# Patient Record
Sex: Female | Born: 1995 | Race: White | Hispanic: No | Marital: Single | Smoking: Never smoker
Health system: Southern US, Community
[De-identification: ages and names within clinical notes are randomized; demographics above are authoritative.]

## PROBLEM LIST (undated history)

## (undated) DIAGNOSIS — K589 Irritable bowel syndrome without diarrhea: Secondary | ICD-10-CM

## (undated) DIAGNOSIS — K219 Gastro-esophageal reflux disease without esophagitis: Secondary | ICD-10-CM

## (undated) HISTORY — PX: WRIST SURGERY: SHX841

## (undated) HISTORY — PX: WISDOM TOOTH EXTRACTION: SHX21

## (undated) HISTORY — PX: COLONOSCOPY: SHX174

---

## 2016-08-10 ENCOUNTER — Encounter (HOSPITAL_COMMUNITY): Payer: Self-pay | Admitting: Emergency Medicine

## 2016-08-10 ENCOUNTER — Emergency Department (HOSPITAL_COMMUNITY)
Admission: EM | Admit: 2016-08-10 | Discharge: 2016-08-10 | Disposition: A | Discharge status: Home / Self Care | Payer: Managed Care, Other (non HMO) | Attending: Emergency Medicine | Admitting: Emergency Medicine

## 2016-08-10 DIAGNOSIS — R1084 Generalized abdominal pain: Secondary | ICD-10-CM

## 2016-08-10 DIAGNOSIS — Z79899 Other long term (current) drug therapy: Secondary | ICD-10-CM | POA: Insufficient documentation

## 2016-08-10 DIAGNOSIS — R197 Diarrhea, unspecified: Secondary | ICD-10-CM

## 2016-08-10 DIAGNOSIS — K529 Noninfective gastroenteritis and colitis, unspecified: Secondary | ICD-10-CM

## 2016-08-10 DIAGNOSIS — R1030 Lower abdominal pain, unspecified: Secondary | ICD-10-CM | POA: Diagnosis present

## 2016-08-10 DIAGNOSIS — R112 Nausea with vomiting, unspecified: Secondary | ICD-10-CM

## 2016-08-10 HISTORY — DX: Irritable bowel syndrome, unspecified: K58.9

## 2016-08-10 MED ORDER — LOPERAMIDE HCL 2 MG PO CAPS: 4.0000 mg | ORAL_CAPSULE | Freq: Every evening | ORAL | 0 refills | Status: AC | PRN

## 2016-08-10 MED ORDER — PROMETHAZINE HCL 25 MG RE SUPP: 25.0000 mg | Freq: Four times a day (QID) | RECTAL | 0 refills | Status: AC | PRN

## 2016-08-10 MED ORDER — ONDANSETRON HCL 4 MG/2ML IJ SOLN: 4.0000 mg | Freq: Once | INTRAMUSCULAR | Status: DC | PRN

## 2016-08-10 MED ORDER — ONDANSETRON 4 MG PO TBDP
4.0000 mg | ORAL_TABLET | Freq: Once | ORAL | Status: AC
  Administered 2016-08-10: 4 mg via ORAL
  Filled 2016-08-10: qty 1

## 2016-08-10 MED ORDER — ACETAMINOPHEN 325 MG PO TABS: 650.0000 mg | ORAL_TABLET | Freq: Once | ORAL | Status: DC

## 2016-08-10 MED ORDER — ONDANSETRON 8 MG PO TBDP: 8.0000 mg | ORAL_TABLET | Freq: Three times a day (TID) | ORAL | 0 refills | Status: AC | PRN

## 2016-08-10 NOTE — ED Triage Notes (Signed)
Patient c/o generalized abd pain with v/d since last night. Patient also started her period but abd pain more all over than her normal.  Patient states that she feels she is dehydrated.

## 2016-08-10 NOTE — ED Provider Notes (Signed)
[facility]-EMERGENCY DEPT Provider Note   CSN: [id number] Arrival date & time: 08/10/16  [id number]     History   Chief Complaint Chief Complaint  Patient presents with  . Abdominal Pain  . Emesis  . Diarrhea    HPI Julie BayleySarah Mattila is a 21 y.o. female.  HPI  (S) Julie [name] is a 21 y.o. female with complaint of gastrointestinal symptoms of watery diarrhea, lower abdominal cramps, nausea, vomiting, dehydration for 1 days. No blood in stool. Pt did eat sushi yday, but it seemed normal. No sick contacts, no travel hx. No fevers. Pt has IBS, but the current symptoms are not related to her ibs.  Pt has had 10 episodes of each emesis and diarrhea. LMP was 1 week ago.   Past Medical History:  Diagnosis Date  . IBS (irritable bowel syndrome)     There are no active problems to display for this patient.   Past Surgical History:  Procedure Laterality Date  . WRIST SURGERY      OB History    No data available       Home Medications    Prior to Admission medications   Medication Sig Start Date End Date Taking? Authorizing Provider  acetaminophen (TYLENOL) 500 MG tablet Take 500 mg by mouth every 6 (six) hours as needed for mild pain or headache.   Yes Historical Provider, MD  alosetron (LOTRONEX) 0.5 MG tablet Take 0.5 mg by mouth 2 (two) times daily.   Yes Historical Provider, MD  diphenoxylate-atropine (LOMOTIL) 2.5-0.025 MG tablet Take 1-2 tablets by mouth 4 (four) times daily as needed for diarrhea or loose stools.   Yes Historical Provider, MD  norelgestromin-ethinyl estradiol (ORTHO EVRA) 150-35 MCG/24HR transdermal patch Place 1 patch onto the skin once a week. Apply one patch weekly on Wednesday for 3 weeks  and leave off for 1 week   Yes Historical Provider, MD  loperamide (IMODIUM) 2 MG capsule Take 2 capsules (4 mg total) by mouth at bedtime as needed for diarrhea or loose stools. 08/10/16 08/16/16  Derwood KaplanAnkit Elyan Vanwieren, MD  ondansetron (ZOFRAN ODT) 8 MG disintegrating tablet Take 1  tablet (8 mg total) by mouth every 8 (eight) hours as needed for nausea. 08/10/16   Derwood KaplanAnkit Colleena Kurtenbach, MD  promethazine (PHENERGAN) 25 MG suppository Place 1 suppository (25 mg total) rectally every 6 (six) hours as needed for nausea. 08/10/16   Derwood KaplanAnkit Elvira Langston, MD    Family History No family history on file.  Social History Social History  Substance Use Topics  . Smoking status: Never Smoker  . Smokeless tobacco: Never Used  . Alcohol use Yes     Comment: occasional      Allergies   Cefzil [cefprozil] and Zithromax [azithromycin]   Review of Systems Review of Systems  Constitutional: Positive for activity change.  Gastrointestinal: Positive for abdominal pain, diarrhea, nausea and vomiting.  Genitourinary: Negative for dysuria.  Allergic/Immunologic: Negative for immunocompromised state.  Neurological: Negative for light-headedness.     Physical Exam Updated Vital Signs BP 124/91 (BP Location: Right Arm)   Pulse 106   Temp 98.7 F (37.1 C) (Oral)   Resp 18   Ht 5\' 6"  (1.676 m)   LMP 08/09/2016   SpO2 100%   Physical Exam  Constitutional: She is oriented to person, place, and time. She appears well-developed and well-nourished.  HENT:  Head: Normocephalic and atraumatic.  Mucus membranes are dry  Eyes: EOM are normal. Pupils are equal, round, and reactive to light.  Neck:  Neck supple.  Cardiovascular: Normal rate, regular rhythm and normal heart sounds.   No murmur heard. Pulmonary/Chest: Effort normal. No respiratory distress.  Abdominal: Soft. She exhibits no distension. There is no tenderness. There is no rebound and no guarding.  Neurological: She is alert and oriented to person, place, and time.  Skin: Skin is warm and dry.  Nursing note and vitals reviewed.    ED Treatments / Results  Labs (all labs ordered are listed, but only abnormal results are displayed) Labs Reviewed  URINALYSIS, ROUTINE W REFLEX MICROSCOPIC    EKG  EKG Interpretation None         Radiology No results found.  Procedures Procedures (including critical care time)  Medications Ordered in ED Medications  acetaminophen (TYLENOL) tablet 650 mg (not administered)  ondansetron (ZOFRAN-ODT) disintegrating tablet 4 mg (4 mg Oral Given 08/10/16 0901)     Initial Impression / Assessment and Plan / ED Course  I have reviewed the triage vital signs and the nursing notes.  Pertinent labs & imaging results that were available during my care of the patient were reviewed by me and considered in my medical decision making (see chart for details).     Pt with n/v/diarrhea/abd pain. Physical exam reveals the patient appears well. Hydration status: well hydrated. Abdomen: abdomen is soft without significant tenderness, masses, organomegaly or guarding..  (A) Viral Gastroenteritis  (P) I have recommended small amounts clear fluids frequently, Pedialyte, soups, juices, water, advance diet as tolerated and Imodium OTC prn diarrhea. Return office visit if symptoms persist or worsen; I have alerted the patient to call if high fever, dehydration, marked weakness, fainting, increased abdominal pain, blood in stool or vomit.  Final Clinical Impressions(s) / ED Diagnoses   Final diagnoses:  Nausea vomiting and diarrhea  Gastroenteritis  Generalized abdominal pain    New Prescriptions New Prescriptions   LOPERAMIDE (IMODIUM) 2 MG CAPSULE    Take 2 capsules (4 mg total) by mouth at bedtime as needed for diarrhea or loose stools.   ONDANSETRON (ZOFRAN ODT) 8 MG DISINTEGRATING TABLET    Take 1 tablet (8 mg total) by mouth every 8 (eight) hours as needed for nausea.   PROMETHAZINE (PHENERGAN) 25 MG SUPPOSITORY    Place 1 suppository (25 mg total) rectally every 6 (six) hours as needed for nausea.     Derwood Kaplan, MD 08/10/16 1120

## 2016-08-10 NOTE — Discharge Instructions (Signed)
Please return to the ER if your symptoms worsen; you have increased pain, fevers, chills, inability to keep any medications down, severe dehydration, bloody stools. Hydrate well. Clear liquid diet for the next 2-3 days and then slowly advance to normal foods.

## 2017-08-06 DIAGNOSIS — Z13220 Encounter for screening for lipoid disorders: Secondary | ICD-10-CM | POA: Diagnosis not present

## 2017-08-06 DIAGNOSIS — Z0000 Encounter for general adult medical examination without abnormal findings: Secondary | ICD-10-CM | POA: Diagnosis not present

## 2017-08-10 DIAGNOSIS — R1084 Generalized abdominal pain: Secondary | ICD-10-CM | POA: Diagnosis not present

## 2017-08-10 DIAGNOSIS — K580 Irritable bowel syndrome with diarrhea: Secondary | ICD-10-CM | POA: Diagnosis not present

## 2017-10-14 DIAGNOSIS — Z6828 Body mass index (BMI) 28.0-28.9, adult: Secondary | ICD-10-CM | POA: Diagnosis not present

## 2017-10-14 DIAGNOSIS — K580 Irritable bowel syndrome with diarrhea: Secondary | ICD-10-CM | POA: Diagnosis not present

## 2017-11-26 DIAGNOSIS — Z113 Encounter for screening for infections with a predominantly sexual mode of transmission: Secondary | ICD-10-CM | POA: Diagnosis not present

## 2017-11-26 DIAGNOSIS — Z01419 Encounter for gynecological examination (general) (routine) without abnormal findings: Secondary | ICD-10-CM | POA: Diagnosis not present

## 2018-04-18 DIAGNOSIS — H00014 Hordeolum externum left upper eyelid: Secondary | ICD-10-CM | POA: Diagnosis not present

## 2018-07-06 DIAGNOSIS — K580 Irritable bowel syndrome with diarrhea: Secondary | ICD-10-CM | POA: Diagnosis not present

## 2018-07-06 DIAGNOSIS — R1084 Generalized abdominal pain: Secondary | ICD-10-CM | POA: Diagnosis not present

## 2018-07-15 DIAGNOSIS — J029 Acute pharyngitis, unspecified: Secondary | ICD-10-CM | POA: Diagnosis not present

## 2018-12-01 DIAGNOSIS — Z01411 Encounter for gynecological examination (general) (routine) with abnormal findings: Secondary | ICD-10-CM | POA: Diagnosis not present

## 2018-12-01 DIAGNOSIS — Z113 Encounter for screening for infections with a predominantly sexual mode of transmission: Secondary | ICD-10-CM | POA: Diagnosis not present

## 2018-12-12 ENCOUNTER — Other Ambulatory Visit: Payer: Self-pay | Admitting: Obstetrics and Gynecology

## 2018-12-12 DIAGNOSIS — N644 Mastodynia: Secondary | ICD-10-CM

## 2018-12-12 DIAGNOSIS — N6310 Unspecified lump in the right breast, unspecified quadrant: Secondary | ICD-10-CM | POA: Diagnosis not present

## 2018-12-22 ENCOUNTER — Other Ambulatory Visit: Payer: Self-pay

## 2018-12-22 ENCOUNTER — Ambulatory Visit
Admission: RE | Admit: 2018-12-22 | Discharge: 2018-12-22 | Disposition: A | Discharge status: Home / Self Care | Payer: BC Managed Care – PPO | Source: Ambulatory Visit | Attending: Obstetrics and Gynecology | Admitting: Obstetrics and Gynecology

## 2018-12-22 DIAGNOSIS — N644 Mastodynia: Secondary | ICD-10-CM

## 2019-01-04 DIAGNOSIS — Z13220 Encounter for screening for lipoid disorders: Secondary | ICD-10-CM | POA: Diagnosis not present

## 2019-01-04 DIAGNOSIS — K580 Irritable bowel syndrome with diarrhea: Secondary | ICD-10-CM | POA: Diagnosis not present

## 2019-01-04 DIAGNOSIS — Z0000 Encounter for general adult medical examination without abnormal findings: Secondary | ICD-10-CM | POA: Diagnosis not present

## 2019-01-04 DIAGNOSIS — Z23 Encounter for immunization: Secondary | ICD-10-CM | POA: Diagnosis not present

## 2019-01-12 DIAGNOSIS — B360 Pityriasis versicolor: Secondary | ICD-10-CM | POA: Diagnosis not present

## 2019-02-16 DIAGNOSIS — Z20828 Contact with and (suspected) exposure to other viral communicable diseases: Secondary | ICD-10-CM | POA: Diagnosis not present

## 2019-04-11 DIAGNOSIS — Z20828 Contact with and (suspected) exposure to other viral communicable diseases: Secondary | ICD-10-CM | POA: Diagnosis not present

## 2019-04-30 DIAGNOSIS — N390 Urinary tract infection, site not specified: Secondary | ICD-10-CM | POA: Diagnosis not present

## 2019-05-09 DIAGNOSIS — Z20828 Contact with and (suspected) exposure to other viral communicable diseases: Secondary | ICD-10-CM | POA: Diagnosis not present

## 2019-05-09 DIAGNOSIS — J029 Acute pharyngitis, unspecified: Secondary | ICD-10-CM | POA: Diagnosis not present

## 2019-05-09 DIAGNOSIS — Z7189 Other specified counseling: Secondary | ICD-10-CM | POA: Diagnosis not present

## 2019-05-10 DIAGNOSIS — Z03818 Encounter for observation for suspected exposure to other biological agents ruled out: Secondary | ICD-10-CM | POA: Diagnosis not present

## 2019-05-11 DIAGNOSIS — Z20828 Contact with and (suspected) exposure to other viral communicable diseases: Secondary | ICD-10-CM | POA: Diagnosis not present

## 2019-06-19 DIAGNOSIS — Z20828 Contact with and (suspected) exposure to other viral communicable diseases: Secondary | ICD-10-CM | POA: Diagnosis not present

## 2019-06-21 DIAGNOSIS — J358 Other chronic diseases of tonsils and adenoids: Secondary | ICD-10-CM | POA: Diagnosis not present

## 2019-07-05 ENCOUNTER — Other Ambulatory Visit: Payer: Self-pay

## 2019-07-05 DIAGNOSIS — Z20822 Contact with and (suspected) exposure to covid-19: Secondary | ICD-10-CM

## 2019-07-06 LAB — NOVEL CORONAVIRUS, NAA: SARS-CoV-2, NAA: NOT DETECTED

## 2019-12-15 DIAGNOSIS — Z03818 Encounter for observation for suspected exposure to other biological agents ruled out: Secondary | ICD-10-CM | POA: Diagnosis not present

## 2019-12-15 DIAGNOSIS — Z20822 Contact with and (suspected) exposure to covid-19: Secondary | ICD-10-CM | POA: Diagnosis not present

## 2019-12-18 DIAGNOSIS — Z01419 Encounter for gynecological examination (general) (routine) without abnormal findings: Secondary | ICD-10-CM | POA: Diagnosis not present

## 2019-12-18 DIAGNOSIS — Z124 Encounter for screening for malignant neoplasm of cervix: Secondary | ICD-10-CM | POA: Diagnosis not present

## 2020-01-05 DIAGNOSIS — Z13220 Encounter for screening for lipoid disorders: Secondary | ICD-10-CM | POA: Diagnosis not present

## 2020-01-05 DIAGNOSIS — K580 Irritable bowel syndrome with diarrhea: Secondary | ICD-10-CM | POA: Diagnosis not present

## 2020-01-05 DIAGNOSIS — Z0000 Encounter for general adult medical examination without abnormal findings: Secondary | ICD-10-CM | POA: Diagnosis not present

## 2020-01-30 DIAGNOSIS — Z20822 Contact with and (suspected) exposure to covid-19: Secondary | ICD-10-CM | POA: Diagnosis not present

## 2020-03-05 DIAGNOSIS — L309 Dermatitis, unspecified: Secondary | ICD-10-CM | POA: Diagnosis not present

## 2020-05-13 DIAGNOSIS — Z20822 Contact with and (suspected) exposure to covid-19: Secondary | ICD-10-CM | POA: Diagnosis not present

## 2020-06-06 DIAGNOSIS — Z20822 Contact with and (suspected) exposure to covid-19: Secondary | ICD-10-CM | POA: Diagnosis not present

## 2020-06-06 DIAGNOSIS — Z03818 Encounter for observation for suspected exposure to other biological agents ruled out: Secondary | ICD-10-CM | POA: Diagnosis not present

## 2020-06-26 DIAGNOSIS — Z20822 Contact with and (suspected) exposure to covid-19: Secondary | ICD-10-CM | POA: Diagnosis not present

## 2020-08-03 DIAGNOSIS — Z03818 Encounter for observation for suspected exposure to other biological agents ruled out: Secondary | ICD-10-CM | POA: Diagnosis not present

## 2020-08-03 DIAGNOSIS — Z20822 Contact with and (suspected) exposure to covid-19: Secondary | ICD-10-CM | POA: Diagnosis not present

## 2020-08-14 DIAGNOSIS — Z20822 Contact with and (suspected) exposure to covid-19: Secondary | ICD-10-CM | POA: Diagnosis not present

## 2020-09-21 IMAGING — US ULTRASOUND RIGHT BREAST LIMITED
1 series · 2 of 2 positions shown · non-contrast
Comparison: None.

CLINICAL DATA: Right breast tenderness and focal thickening seen
clinically.

EXAM:
ULTRASOUND OF THE RIGHT BREAST

[Series 1: ultrasound right breast limited · 0.07mm/px · 2 of 2 slices shown]
[im 1/2]
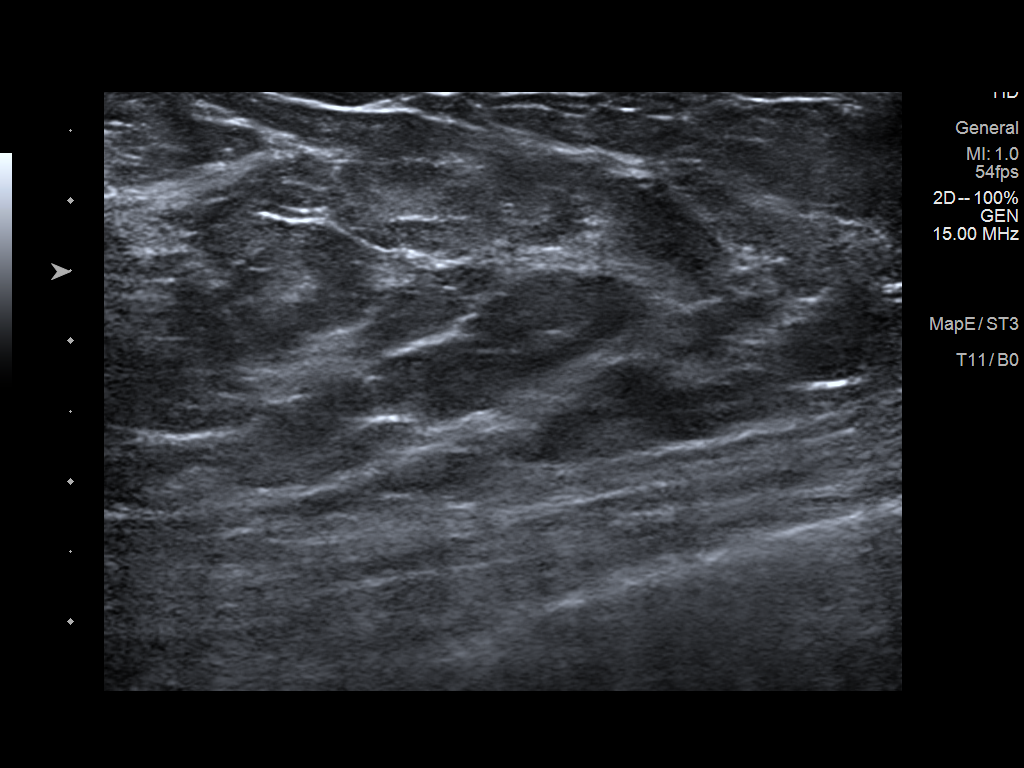
[im 2/2]
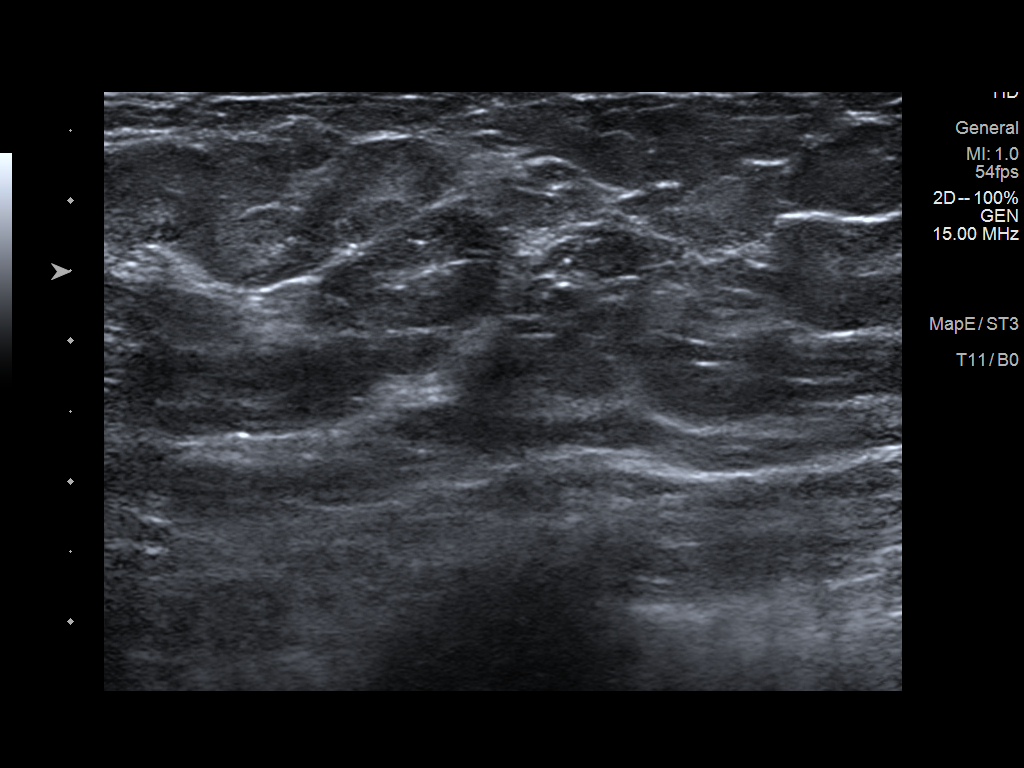

[2 of 2 positions shown; findings below may reference images not displayed]

FINDINGS: On physical exam,

Targeted ultrasound is performed, showing no suspicious masses or
shadowing lesions at the site thickening/tenderness.
IMPRESSION: No sonographic evidence of malignancy in the right breast.

RECOMMENDATION:
Further management of patient's right breast findings should be
based on clinical grounds.

Screening mammogram at age 39 unless there are persistent or
intervening clinical concerns. (Code:T6-O-W7X)

I have discussed the findings and recommendations with the patient.
Results were also provided in writing at the conclusion of the
visit. If applicable, a reminder letter will be sent to the patient
regarding the next appointment.

BI-RADS CATEGORY  1: Negative.

## 2020-10-21 DIAGNOSIS — Z20822 Contact with and (suspected) exposure to covid-19: Secondary | ICD-10-CM | POA: Diagnosis not present

## 2020-10-23 DIAGNOSIS — Z20822 Contact with and (suspected) exposure to covid-19: Secondary | ICD-10-CM | POA: Diagnosis not present

## 2020-11-14 DIAGNOSIS — S61211A Laceration without foreign body of left index finger without damage to nail, initial encounter: Secondary | ICD-10-CM | POA: Diagnosis not present

## 2020-11-22 DIAGNOSIS — R21 Rash and other nonspecific skin eruption: Secondary | ICD-10-CM | POA: Diagnosis not present

## 2020-11-22 DIAGNOSIS — S61311D Laceration without foreign body of left index finger with damage to nail, subsequent encounter: Secondary | ICD-10-CM | POA: Diagnosis not present

## 2020-11-26 DIAGNOSIS — Z20822 Contact with and (suspected) exposure to covid-19: Secondary | ICD-10-CM | POA: Diagnosis not present

## 2020-12-04 DIAGNOSIS — Z20822 Contact with and (suspected) exposure to covid-19: Secondary | ICD-10-CM | POA: Diagnosis not present

## 2020-12-23 DIAGNOSIS — Z01419 Encounter for gynecological examination (general) (routine) without abnormal findings: Secondary | ICD-10-CM | POA: Diagnosis not present

## 2020-12-31 DIAGNOSIS — L817 Pigmented purpuric dermatosis: Secondary | ICD-10-CM | POA: Diagnosis not present

## 2021-01-08 DIAGNOSIS — K580 Irritable bowel syndrome with diarrhea: Secondary | ICD-10-CM | POA: Diagnosis not present

## 2021-01-08 DIAGNOSIS — Z0000 Encounter for general adult medical examination without abnormal findings: Secondary | ICD-10-CM | POA: Diagnosis not present

## 2021-01-08 DIAGNOSIS — Z13220 Encounter for screening for lipoid disorders: Secondary | ICD-10-CM | POA: Diagnosis not present

## 2021-01-28 DIAGNOSIS — Z20822 Contact with and (suspected) exposure to covid-19: Secondary | ICD-10-CM | POA: Diagnosis not present

## 2021-02-04 DIAGNOSIS — B360 Pityriasis versicolor: Secondary | ICD-10-CM | POA: Diagnosis not present

## 2021-02-04 DIAGNOSIS — Z20822 Contact with and (suspected) exposure to covid-19: Secondary | ICD-10-CM | POA: Diagnosis not present

## 2021-02-04 DIAGNOSIS — L819 Disorder of pigmentation, unspecified: Secondary | ICD-10-CM | POA: Diagnosis not present

## 2021-03-05 DIAGNOSIS — Z20822 Contact with and (suspected) exposure to covid-19: Secondary | ICD-10-CM | POA: Diagnosis not present

## 2021-03-19 DIAGNOSIS — L700 Acne vulgaris: Secondary | ICD-10-CM | POA: Diagnosis not present

## 2021-07-17 DIAGNOSIS — Z20822 Contact with and (suspected) exposure to covid-19: Secondary | ICD-10-CM | POA: Diagnosis not present

## 2021-12-23 DIAGNOSIS — Z01419 Encounter for gynecological examination (general) (routine) without abnormal findings: Secondary | ICD-10-CM | POA: Diagnosis not present

## 2022-01-16 DIAGNOSIS — Z13220 Encounter for screening for lipoid disorders: Secondary | ICD-10-CM | POA: Diagnosis not present

## 2022-01-16 DIAGNOSIS — R21 Rash and other nonspecific skin eruption: Secondary | ICD-10-CM | POA: Diagnosis not present

## 2022-01-16 DIAGNOSIS — Z0000 Encounter for general adult medical examination without abnormal findings: Secondary | ICD-10-CM | POA: Diagnosis not present

## 2022-01-16 DIAGNOSIS — K580 Irritable bowel syndrome with diarrhea: Secondary | ICD-10-CM | POA: Diagnosis not present

## 2022-01-28 DIAGNOSIS — B360 Pityriasis versicolor: Secondary | ICD-10-CM | POA: Diagnosis not present

## 2022-02-11 DIAGNOSIS — J029 Acute pharyngitis, unspecified: Secondary | ICD-10-CM | POA: Diagnosis not present

## 2022-02-11 DIAGNOSIS — Z6831 Body mass index (BMI) 31.0-31.9, adult: Secondary | ICD-10-CM | POA: Diagnosis not present

## 2022-02-11 DIAGNOSIS — K589 Irritable bowel syndrome without diarrhea: Secondary | ICD-10-CM | POA: Insufficient documentation

## 2022-02-12 DIAGNOSIS — Z6831 Body mass index (BMI) 31.0-31.9, adult: Secondary | ICD-10-CM | POA: Diagnosis not present

## 2022-02-12 DIAGNOSIS — J029 Acute pharyngitis, unspecified: Secondary | ICD-10-CM | POA: Diagnosis not present

## 2022-06-27 ENCOUNTER — Ambulatory Visit: Admit: 2022-06-27 | Discharge status: Absent without leave | Payer: BC Managed Care – PPO

## 2022-06-28 DIAGNOSIS — U071 COVID-19: Secondary | ICD-10-CM | POA: Diagnosis not present

## 2022-06-29 DIAGNOSIS — U071 COVID-19: Secondary | ICD-10-CM | POA: Diagnosis not present

## 2022-06-29 DIAGNOSIS — H1033 Unspecified acute conjunctivitis, bilateral: Secondary | ICD-10-CM | POA: Diagnosis not present

## 2022-06-29 DIAGNOSIS — J019 Acute sinusitis, unspecified: Secondary | ICD-10-CM | POA: Diagnosis not present

## 2022-06-29 DIAGNOSIS — H66003 Acute suppurative otitis media without spontaneous rupture of ear drum, bilateral: Secondary | ICD-10-CM | POA: Diagnosis not present

## 2022-07-06 DIAGNOSIS — H6993 Unspecified Eustachian tube disorder, bilateral: Secondary | ICD-10-CM | POA: Diagnosis not present

## 2022-11-29 DIAGNOSIS — S70269A Insect bite (nonvenomous), unspecified hip, initial encounter: Secondary | ICD-10-CM | POA: Diagnosis not present

## 2022-11-29 DIAGNOSIS — S80869A Insect bite (nonvenomous), unspecified lower leg, initial encounter: Secondary | ICD-10-CM | POA: Diagnosis not present

## 2022-11-29 DIAGNOSIS — L089 Local infection of the skin and subcutaneous tissue, unspecified: Secondary | ICD-10-CM | POA: Diagnosis not present

## 2022-11-29 DIAGNOSIS — S70369A Insect bite (nonvenomous), unspecified thigh, initial encounter: Secondary | ICD-10-CM | POA: Diagnosis not present

## 2023-01-07 DIAGNOSIS — Z124 Encounter for screening for malignant neoplasm of cervix: Secondary | ICD-10-CM | POA: Diagnosis not present

## 2023-01-07 DIAGNOSIS — Z01419 Encounter for gynecological examination (general) (routine) without abnormal findings: Secondary | ICD-10-CM | POA: Diagnosis not present

## 2023-01-07 DIAGNOSIS — Z6833 Body mass index (BMI) 33.0-33.9, adult: Secondary | ICD-10-CM | POA: Diagnosis not present

## 2023-01-07 DIAGNOSIS — Z302 Encounter for sterilization: Secondary | ICD-10-CM | POA: Diagnosis not present

## 2023-02-04 DIAGNOSIS — Z13220 Encounter for screening for lipoid disorders: Secondary | ICD-10-CM | POA: Diagnosis not present

## 2023-02-04 DIAGNOSIS — K580 Irritable bowel syndrome with diarrhea: Secondary | ICD-10-CM | POA: Diagnosis not present

## 2023-02-04 DIAGNOSIS — J302 Other seasonal allergic rhinitis: Secondary | ICD-10-CM | POA: Diagnosis not present

## 2023-02-04 DIAGNOSIS — Z0000 Encounter for general adult medical examination without abnormal findings: Secondary | ICD-10-CM | POA: Diagnosis not present

## 2023-02-04 DIAGNOSIS — K219 Gastro-esophageal reflux disease without esophagitis: Secondary | ICD-10-CM | POA: Diagnosis not present

## 2023-05-25 ENCOUNTER — Encounter (HOSPITAL_BASED_OUTPATIENT_CLINIC_OR_DEPARTMENT_OTHER): Payer: Self-pay | Admitting: Obstetrics

## 2023-05-25 NOTE — Progress Notes (Signed)
Spoke w/ via phone for pre-op interview--- Julie [name] needs dos----  CBC, UPT and T&S per surgeon.       [name] results------ COVID test -----patient states asymptomatic no test needed Arrive at -------0730 NPO after MN NO Solid Food.   Med rec completed Medications to take morning of surgery -----Pepcid and Viberzi Diabetic medication ----- Patient instructed no nail polish to be worn day of surgery Patient instructed to bring photo id and insurance card day of surgery Patient aware to have Driver (ride ) / caregiver    for 24 hours after surgery - Mother Julie [name] Patient Special Instructions ----- Pre-Op special Instructions ----- Patient verbalized understanding of instructions that were given at this phone interview. Patient denies chest pain, sob, fever, cough at the interview.

## 2023-06-04 ENCOUNTER — Other Ambulatory Visit: Payer: Self-pay

## 2023-06-04 ENCOUNTER — Encounter (HOSPITAL_BASED_OUTPATIENT_CLINIC_OR_DEPARTMENT_OTHER)
Admission: RE | Disposition: A | Discharge status: Home / Self Care | Payer: Self-pay | Source: Home / Self Care | Attending: Obstetrics

## 2023-06-04 ENCOUNTER — Ambulatory Visit (HOSPITAL_BASED_OUTPATIENT_CLINIC_OR_DEPARTMENT_OTHER): Payer: BC Managed Care – PPO | Admitting: Anesthesiology

## 2023-06-04 ENCOUNTER — Encounter (HOSPITAL_BASED_OUTPATIENT_CLINIC_OR_DEPARTMENT_OTHER): Payer: Self-pay | Admitting: Obstetrics

## 2023-06-04 ENCOUNTER — Ambulatory Visit (HOSPITAL_BASED_OUTPATIENT_CLINIC_OR_DEPARTMENT_OTHER)
Admission: RE | Admit: 2023-06-04 | Discharge: 2023-06-04 | Disposition: A | Discharge status: Home / Self Care | Payer: BC Managed Care – PPO | Attending: Obstetrics | Admitting: Obstetrics

## 2023-06-04 DIAGNOSIS — D282 Benign neoplasm of uterine tubes and ligaments: Secondary | ICD-10-CM | POA: Diagnosis not present

## 2023-06-04 DIAGNOSIS — Z302 Encounter for sterilization: Secondary | ICD-10-CM | POA: Insufficient documentation

## 2023-06-04 DIAGNOSIS — Z3009 Encounter for other general counseling and advice on contraception: Secondary | ICD-10-CM

## 2023-06-04 HISTORY — PX: LAPAROSCOPIC BILATERAL SALPINGECTOMY: SHX5889

## 2023-06-04 HISTORY — DX: Gastro-esophageal reflux disease without esophagitis: K21.9

## 2023-06-04 LAB — ABO/RH: ABO/RH(D): O POS

## 2023-06-04 LAB — TYPE AND SCREEN
ABO/RH(D): O POS
Antibody Screen: NEGATIVE

## 2023-06-04 LAB — CBC
HCT: 40.5 % (ref 36.0–46.0)
Hemoglobin: 13.5 g/dL (ref 12.0–15.0)
MCH: 30.1 pg (ref 26.0–34.0)
MCHC: 33.3 g/dL (ref 30.0–36.0)
MCV: 90.2 fL (ref 80.0–100.0)
Platelets: 309 10*3/uL (ref 150–400)
RBC: 4.49 MIL/uL (ref 3.87–5.11)
RDW: 13.4 % (ref 11.5–15.5)
WBC: 7.8 10*3/uL (ref 4.0–10.5)
nRBC: 0 % (ref 0.0–0.2)

## 2023-06-04 LAB — POCT PREGNANCY, URINE: Preg Test, Ur: NEGATIVE

## 2023-06-04 SURGERY — SALPINGECTOMY, BILATERAL, LAPAROSCOPIC
Anesthesia: General | Site: Abdomen | Laterality: Bilateral

## 2023-06-04 MED ORDER — SUGAMMADEX SODIUM 200 MG/2ML IV SOLN
INTRAVENOUS | Status: DC | PRN
  Administered 2023-06-04: 200 mg via INTRAVENOUS

## 2023-06-04 MED ORDER — SODIUM CHLORIDE 0.9 % IV SOLN: INTRAVENOUS | Status: DC

## 2023-06-04 MED ORDER — DEXAMETHASONE SODIUM PHOSPHATE 10 MG/ML IJ SOLN
INTRAMUSCULAR | Status: AC
  Filled 2023-06-04: qty 1

## 2023-06-04 MED ORDER — BUPIVACAINE HCL (PF) 0.25 % IJ SOLN
INTRAMUSCULAR | Status: DC | PRN
  Administered 2023-06-04: 13 mL

## 2023-06-04 MED ORDER — ONDANSETRON HCL 4 MG/2ML IJ SOLN
INTRAMUSCULAR | Status: DC | PRN
  Administered 2023-06-04: 4 mg via INTRAVENOUS

## 2023-06-04 MED ORDER — PROPOFOL 10 MG/ML IV BOLUS
INTRAVENOUS | Status: AC
  Filled 2023-06-04: qty 20

## 2023-06-04 MED ORDER — LIDOCAINE HCL (CARDIAC) PF 100 MG/5ML IV SOSY
PREFILLED_SYRINGE | INTRAVENOUS | Status: DC | PRN
  Administered 2023-06-04: 60 mg via INTRAVENOUS

## 2023-06-04 MED ORDER — KETOROLAC TROMETHAMINE 30 MG/ML IJ SOLN
INTRAMUSCULAR | Status: AC
  Filled 2023-06-04: qty 1

## 2023-06-04 MED ORDER — ROCURONIUM BROMIDE 10 MG/ML (PF) SYRINGE
PREFILLED_SYRINGE | INTRAVENOUS | Status: AC
  Filled 2023-06-04: qty 10

## 2023-06-04 MED ORDER — ACETAMINOPHEN 500 MG PO TABS
ORAL_TABLET | ORAL | Status: AC
  Filled 2023-06-04: qty 2

## 2023-06-04 MED ORDER — FENTANYL CITRATE (PF) 100 MCG/2ML IJ SOLN
INTRAMUSCULAR | Status: DC | PRN
  Administered 2023-06-04 (×4): 50 ug via INTRAVENOUS

## 2023-06-04 MED ORDER — PROPOFOL 10 MG/ML IV BOLUS
INTRAVENOUS | Status: DC | PRN
  Administered 2023-06-04: 200 mg via INTRAVENOUS

## 2023-06-04 MED ORDER — FENTANYL CITRATE (PF) 100 MCG/2ML IJ SOLN
INTRAMUSCULAR | Status: AC
  Filled 2023-06-04: qty 2

## 2023-06-04 MED ORDER — ONDANSETRON HCL 4 MG/2ML IJ SOLN
INTRAMUSCULAR | Status: AC
  Filled 2023-06-04: qty 2

## 2023-06-04 MED ORDER — ROCURONIUM BROMIDE 100 MG/10ML IV SOLN
INTRAVENOUS | Status: DC | PRN
  Administered 2023-06-04: 50 mg via INTRAVENOUS

## 2023-06-04 MED ORDER — DEXMEDETOMIDINE HCL IN NACL 80 MCG/20ML IV SOLN
INTRAVENOUS | Status: DC | PRN
  Administered 2023-06-04: 8 ug via INTRAVENOUS

## 2023-06-04 MED ORDER — MIDAZOLAM HCL 2 MG/2ML IJ SOLN
INTRAMUSCULAR | Status: AC
Start: 2023-06-04 — End: ?
  Filled 2023-06-04: qty 2

## 2023-06-04 MED ORDER — FENTANYL CITRATE (PF) 100 MCG/2ML IJ SOLN: 25.0000 ug | INTRAMUSCULAR | Status: DC | PRN

## 2023-06-04 MED ORDER — SCOPOLAMINE 1 MG/3DAYS TD PT72
1.0000 | MEDICATED_PATCH | TRANSDERMAL | Status: DC
  Administered 2023-06-04: 1.5 mg via TRANSDERMAL

## 2023-06-04 MED ORDER — HYDROMORPHONE HCL 2 MG PO TABS: 2.0000 mg | ORAL_TABLET | Freq: Four times a day (QID) | ORAL | 0 refills | Status: AC | PRN

## 2023-06-04 MED ORDER — OXYCODONE HCL 5 MG PO TABS: 5.0000 mg | ORAL_TABLET | Freq: Once | ORAL | Status: DC | PRN

## 2023-06-04 MED ORDER — KETOROLAC TROMETHAMINE 30 MG/ML IJ SOLN
INTRAMUSCULAR | Status: DC | PRN
  Administered 2023-06-04: 30 mg via INTRAVENOUS

## 2023-06-04 MED ORDER — LIDOCAINE HCL (PF) 2 % IJ SOLN
INTRAMUSCULAR | Status: AC
  Filled 2023-06-04: qty 5

## 2023-06-04 MED ORDER — ACETAMINOPHEN 500 MG PO TABS
1000.0000 mg | ORAL_TABLET | Freq: Once | ORAL | Status: AC
Start: 2023-06-04 — End: 2023-06-04
  Administered 2023-06-04: 1000 mg via ORAL

## 2023-06-04 MED ORDER — SCOPOLAMINE 1 MG/3DAYS TD PT72
MEDICATED_PATCH | TRANSDERMAL | Status: AC
  Filled 2023-06-04: qty 1

## 2023-06-04 MED ORDER — OXYCODONE HCL 5 MG/5ML PO SOLN: 5.0000 mg | Freq: Once | ORAL | Status: DC | PRN

## 2023-06-04 MED ORDER — LACTATED RINGERS IV SOLN: INTRAVENOUS | Status: DC

## 2023-06-04 MED ORDER — PHENYLEPHRINE 80 MCG/ML (10ML) SYRINGE FOR IV PUSH (FOR BLOOD PRESSURE SUPPORT)
PREFILLED_SYRINGE | INTRAVENOUS | Status: AC
  Filled 2023-06-04: qty 10

## 2023-06-04 MED ORDER — AMISULPRIDE (ANTIEMETIC) 5 MG/2ML IV SOLN: 10.0000 mg | Freq: Once | INTRAVENOUS | Status: DC | PRN

## 2023-06-04 MED ORDER — MIDAZOLAM HCL 2 MG/2ML IJ SOLN
INTRAMUSCULAR | Status: DC | PRN
  Administered 2023-06-04: 2 mg via INTRAVENOUS

## 2023-06-04 MED ORDER — DEXAMETHASONE SODIUM PHOSPHATE 4 MG/ML IJ SOLN
INTRAMUSCULAR | Status: DC | PRN
  Administered 2023-06-04: 8 mg via INTRAVENOUS

## 2023-06-04 SURGICAL SUPPLY — 29 items
COVER MAYO STAND STRL (DRAPES) ×1 IMPLANT
DERMABOND ADVANCED .7 DNX12 (GAUZE/BANDAGES/DRESSINGS) ×1 IMPLANT
DRAPE SURG IRRIG POUCH 19X23 (DRAPES) ×1 IMPLANT
DURAPREP 26ML APPLICATOR (WOUND CARE) ×1 IMPLANT
GAUZE 4X4 16PLY ~~LOC~~+RFID DBL (SPONGE) IMPLANT
GLOVE BIOGEL PI IND STRL 6.5 (GLOVE) ×2 IMPLANT
GLOVE ECLIPSE 6.0 STRL STRAW (GLOVE) ×1 IMPLANT
GOWN STRL REUS W/TWL LRG LVL3 (GOWN DISPOSABLE) ×1 IMPLANT
IRRIG SUCT STRYKERFLOW 2 WTIP (MISCELLANEOUS) IMPLANT
IRRIGATION SUCT STRKRFLW 2 WTP (MISCELLANEOUS) IMPLANT
KIT PINK PAD W/HEAD ARE REST (MISCELLANEOUS) ×1 IMPLANT
KIT PINK PAD W/HEAD ARM REST (MISCELLANEOUS) ×1 IMPLANT
KIT TURNOVER CYSTO (KITS) ×1 IMPLANT
LIGASURE VESSEL 5MM BLUNT TIP (ELECTROSURGICAL) IMPLANT
NS IRRIG 1000ML POUR BTL (IV SOLUTION) ×1 IMPLANT
PACK LAPAROSCOPY BASIN (CUSTOM PROCEDURE TRAY) ×1 IMPLANT
PROTECTOR NERVE ULNAR (MISCELLANEOUS) ×2 IMPLANT
SET TUBE SMOKE EVAC HIGH FLOW (TUBING) ×1 IMPLANT
SLEEVE SCD COMPRESS KNEE MED (STOCKING) ×1 IMPLANT
SLEEVE Z-THREAD 5X100MM (TROCAR) ×1 IMPLANT
SUT VICRYL RAPIDE 4/0 PS 2 (SUTURE) ×1 IMPLANT
SYS BAG RETRIEVAL 10MM (BASKET) ×1 IMPLANT
SYSTEM BAG RETRIEVAL 10MM (BASKET) IMPLANT
SYSTEM CARTER THOMASON II (TROCAR) IMPLANT
TOWEL OR 17X24 6PK STRL BLUE (TOWEL DISPOSABLE) ×1 IMPLANT
TRAY FOLEY W/BAG SLVR 14FR LF (SET/KITS/TRAYS/PACK) IMPLANT
TROCAR Z-THREAD FIOS 11X100 BL (TROCAR) IMPLANT
TROCAR Z-THREAD FIOS 5X100MM (TROCAR) ×1 IMPLANT
WARMER LAPAROSCOPE (MISCELLANEOUS) ×1 IMPLANT

## 2023-06-04 NOTE — Anesthesia Procedure Notes (Signed)
Procedure Name: Intubation Date/Time: 06/04/2023 9:21 AM  Performed by: Earmon Phoenix, CRNAPre-anesthesia Checklist: Patient identified, Emergency Drugs available, Suction available, Patient being monitored and Timeout performed Patient Re-evaluated:Patient Re-evaluated prior to induction Oxygen Delivery Method: Circle system utilized Preoxygenation: Pre-oxygenation with 100% oxygen Induction Type: IV induction Ventilation: Mask ventilation without difficulty Laryngoscope Size: Mac and 3 Grade View: Grade I Placement Confirmation: positive ETCO2, breath sounds checked- equal and bilateral and ETT inserted through vocal cords under direct vision Secured at: 21 cm Tube secured with: Tape Dental Injury: Teeth and Oropharynx as per pre-operative assessment

## 2023-06-04 NOTE — Transfer of Care (Signed)
Immediate Anesthesia Transfer of Care Note  Patient: Julie [name]  Procedure(s) Performed: LAPAROSCOPIC BILATERAL SALPINGECTOMY (Bilateral: Abdomen)  Patient Location: PACU  Anesthesia Type:General  Level of Consciousness: awake, alert , oriented, and patient cooperative  Airway & Oxygen Therapy: Patient Spontanous Breathing and Patient connected to nasal cannula oxygen  Post-op Assessment: Report given to RN and Post -op Vital signs reviewed and stable  Post vital signs: Reviewed and stable  Last Vitals:  Vitals Value Taken Time  BP 149/104 06/04/23 1025  Temp    Pulse 91 06/04/23 1028  Resp 13 06/04/23 1028  SpO2 98 % 06/04/23 1028  Vitals shown include unfiled device data.  Last Pain:  Vitals:   06/04/23 0820  TempSrc: Oral  PainSc: 0-No pain      Patients Stated Pain Goal: 5 (06/04/23 0820)  Complications: No notable events documented.

## 2023-06-04 NOTE — Anesthesia Preprocedure Evaluation (Signed)
Anesthesia Evaluation  Patient identified by MRN, date of birth, ID band Patient awake    Reviewed: Allergy & Precautions, NPO status , Patient's Chart, lab work & pertinent test results  Airway Mallampati: II  TM Distance: >3 FB Neck ROM: Full    Dental  (+) Dental Advisory Given   Pulmonary neg pulmonary ROS   breath sounds clear to auscultation       Cardiovascular negative cardio ROS  Rhythm:Regular Rate:Normal     Neuro/Psych negative neurological ROS     GI/Hepatic Neg liver ROS,GERD  ,,  Endo/Other  negative endocrine ROS    Renal/GU negative Renal ROS     Musculoskeletal   Abdominal   Peds  Hematology negative hematology ROS (+)   Anesthesia Other Findings   Reproductive/Obstetrics                             Anesthesia Physical Anesthesia Plan  ASA: 2  Anesthesia Plan: General   Post-op Pain Management: Tylenol PO (pre-op)* and Toradol IV (intra-op)*   Induction: Intravenous  PONV Risk Score and Plan: 4 or greater and Dexamethasone, Ondansetron, Midazolam, Scopolamine patch - Pre-op and Treatment may vary due to age or medical condition  Airway Management Planned: Oral ETT  Additional Equipment: None  Intra-op Plan:   Post-operative Plan: Extubation in OR  Informed Consent: I have reviewed the patients History and Physical, chart, labs and discussed the procedure including the risks, benefits and alternatives for the proposed anesthesia with the patient or authorized representative who has indicated his/her understanding and acceptance.     Dental advisory given  Plan Discussed with:   Anesthesia Plan Comments:        Anesthesia Quick Evaluation

## 2023-06-04 NOTE — Anesthesia Postprocedure Evaluation (Signed)
Anesthesia Post Note  Patient: Julie [name]  Procedure(s) Performed: LAPAROSCOPIC BILATERAL SALPINGECTOMY (Bilateral: Abdomen)     Patient location during evaluation: PACU Anesthesia Type: General Level of consciousness: awake and alert Pain management: pain level controlled Vital Signs Assessment: post-procedure vital signs reviewed and stable Respiratory status: spontaneous breathing, nonlabored ventilation, respiratory function stable and patient connected to nasal cannula oxygen Cardiovascular status: blood pressure returned to baseline and stable Postop Assessment: no apparent nausea or vomiting Anesthetic complications: no  No notable events documented.  Last Vitals:  Vitals:   06/04/23 1100 06/04/23 1143  BP: 129/89 129/86  Pulse: 76 68  Resp: 19 18  Temp:    SpO2: 97% 100%    Last Pain:  Vitals:   06/04/23 1100  TempSrc:   PainSc: 0-No pain                 Kennieth Rad

## 2023-06-04 NOTE — Op Note (Signed)
  Pre-Operative Diagnosis: desires permanent sterilization   Postoperative Diagnosis: desires permanent sterilization  Procedure: Laparoscopic bilateral salpingectomy    Surgeon: Marlow Baars, MD   Assistant: Derl Barrow, MD   Anesthesia: General endotracheal anesthesia and 0.25% Marcaine injected infraumbilically.   Operative Findings:Normal appearing uterus and ovaries.  Left tube normal.  Arising from the distal end of the right fallopian tube is a 3 cm simple appearing cyst.  It was separate from the normal right ovary  Specimen: bilateral fallopian tubes   [id number]   Following the appropriate informed consent the patient was taken to the operating room. She was placed in dorsal lithotomy position, and general anesthesia was administered. The abdomen, perineum, and vagina, were prepped in the normal sterile fashion.   A speculum was placed in the vagina.  An acorn uterine manipulator was placed transcervically. The speculum was removed from the vagina. Attention was then turned to the abdominal portion of the case. Following the appropriate draping, 4 cc of 0.25% Marcaine was injected infraumbilically. An incision was made at the base of the umbilicus with a scalpel. The 5 mm Optiview trocar was used to enter the abdomen under direct visualization. Entry was confirmed and the abdomen was insufflated with  CO2 gas.  The intra-abdominal cavity was inspected and findings were as above. The patient was placed in Trendelenburg. Under direct visualization, two additional 5-mm ports were placed into the right and left lower quadrant.  The uterus was elevated.  The abdomen and pelvis were surveyed and findings noted as above.  The LigaSure was then introduced into the abdomen. The left fallopian tube was grasped and the LigaSure was used to sequentially clamp, cut, and burn the mesosalpinx.  The tube was transected at the level of the cornua and removed through the 5 mm trocar.  Likewise, the  right fallopian tube was grasped and the LigaSure was used to sequentially clamp, cut, and burn the mesosalpinx.  The attached paratubal cyst was removed with the left fallopian tube.  The tube was transected at the level of the coruna.  At this time, the umbilical port was extended to 10 mm trocar.  The endocatch bag was introduced to the abdomen and the right tube and cyst removed. The pelvis was surveyed and hemostasis was noted.  This completed the procedure. The Tacy Dura was used to close the fascia at the umbilicus with 0-vicryl. The abdomen was desufflated, the ports were removed.  The skin was closed in a subcuticular fashion with 4-0 vicryl.  Dermabond was applied to the pelvic incisions.  The uterine manipulator was removed from the vagina. All sponge lap needle counts were correct x2.  The patient tolerated the procedure well and was brought to the recovery room in stable condition

## 2023-06-04 NOTE — Discharge Instructions (Addendum)
Please take acetaminophen (tylenol) 1000mg  every 6 hours and ibuprofen (motrin) 600mg  every 6 hours. If pain is not controlled, you may then add the oral dilaudid every 6 hours as needed.       No acetaminophen/Tylenol until after 2:25 pm today if needed.     Post Anesthesia Home Care Instructions  Activity: Get plenty of rest for the remainder of the day. A responsible individual must stay with you for 24 hours following the procedure.  For the next 24 hours, DO NOT: -Drive a car -Advertising copywriter -Drink alcoholic beverages -Take any medication unless instructed by your physician -Make any legal decisions or sign important papers.  Meals: Start with liquid foods such as gelatin or soup. Progress to regular foods as tolerated. Avoid greasy, spicy, heavy foods. If nausea and/or vomiting occur, drink only clear liquids until the nausea and/or vomiting subsides. Call your physician if vomiting continues.  Special Instructions/Symptoms: Your throat may feel dry or sore from the anesthesia or the breathing tube placed in your throat during surgery. If this causes discomfort, gargle with warm salt water. The discomfort should disappear within 24 hours.  If you had a scopolamine patch placed behind your ear for the management of post- operative nausea and/or vomiting:  1. The medication in the patch is effective for 72 hours, after which it should be removed.  Wrap patch in a tissue and discard in the trash. Wash hands thoroughly with soap and water. 2. You may remove the patch earlier than 72 hours if you experience unpleasant side effects which may include dry mouth, dizziness or visual disturbances. 3. Avoid touching the patch. Wash your hands with soap and water after contact with the patch.

## 2023-06-04 NOTE — H&P (Signed)
27 y.o. G0 presents for permanent sterilization via bilateral salpingectomy  Past Medical History:  Diagnosis Date   GERD (gastroesophageal reflux disease)    IBS (irritable bowel syndrome)     Past Surgical History:  Procedure Laterality Date   COLONOSCOPY     WISDOM TOOTH EXTRACTION     WRIST SURGERY      OB History  No obstetric history on file.    Social History   Socioeconomic History   Marital status: Single    Spouse name: Not on file   Number of children: Not on file   Years of education: Not on file   Highest education level: Not on file  Occupational History   Not on file  Tobacco Use   Smoking status: Never   Smokeless tobacco: Never  Vaping Use   Vaping status: Some Days   Substances: CBD  Substance and Sexual Activity   Alcohol use: Yes    Comment: occasional    Drug use: Not Currently    Comment: THC gummies   Sexual activity: Yes    Birth control/protection: Patch  Other Topics Concern   Not on file  Social History Narrative   Not on file   Social Determinants of Health   Financial Resource Strain: Not on file  Food Insecurity: Not on file  Transportation Needs: Not on file  Physical Activity: Not on file  Stress: Not on file  Social Connections: Not on file  Intimate Partner Violence: Not on file   Bactrim [sulfamethoxazole-trimethoprim], Cefzil [cefprozil], and Zithromax [azithromycin]    Vitals:   06/04/23 0820  BP: 131/85  Pulse: 90  Resp: 18  Temp: 98.1 F (36.7 C)  SpO2: 97%     General:  NAD Lungs: CTAB Cardiac: RRR Abdomen:  Soft Ex:  no edema    A/P   27 y.o. presents for permanent sterilization via laparoscopic bilateral salpingectomy.  Desires permanent sterilization via bilateral salpingectomy. Discussed permanent nature of procedure. Discussed the risks of surgery, specifically, the patient was apprised of risks of pain, bleeding requiring blood transfusion, infection requiring antibiotics, injury to nearby organs  (bowel, bladder, nerves, blood vessels, ureter), need for laparotomy to complete the operation, or failure to achieve desired results. We discussed the possible risk of regret. She was informed of the low but real risk of these complications, and understands that the alternative is no surgery. Discussed anticipated post operative course.  If we are unable to remove a tube for any reason--filshie clips are acceptable  Consent signed.  UPT negative in holding.    [facility] [employer] [employer]

## 2023-06-07 ENCOUNTER — Encounter (HOSPITAL_BASED_OUTPATIENT_CLINIC_OR_DEPARTMENT_OTHER): Payer: Self-pay | Admitting: Obstetrics

## 2023-06-07 LAB — SURGICAL PATHOLOGY

## 2023-07-11 DIAGNOSIS — S90822A Blister (nonthermal), left foot, initial encounter: Secondary | ICD-10-CM | POA: Diagnosis not present

## 2023-07-16 DIAGNOSIS — R21 Rash and other nonspecific skin eruption: Secondary | ICD-10-CM | POA: Diagnosis not present

## 2023-07-22 ENCOUNTER — Ambulatory Visit: Payer: BC Managed Care – PPO | Admitting: Podiatry

## 2023-07-22 DIAGNOSIS — R21 Rash and other nonspecific skin eruption: Secondary | ICD-10-CM

## 2023-07-22 DIAGNOSIS — B353 Tinea pedis: Secondary | ICD-10-CM | POA: Diagnosis not present

## 2023-07-22 MED ORDER — CLOTRIMAZOLE-BETAMETHASONE 1-0.05 % EX CREA: 1.0000 | TOPICAL_CREAM | Freq: Two times a day (BID) | CUTANEOUS | 1 refills | Status: AC

## 2023-07-22 MED ORDER — TERBINAFINE HCL 250 MG PO TABS: 250.0000 mg | ORAL_TABLET | Freq: Every day | ORAL | 0 refills | Status: DC

## 2023-07-22 NOTE — Progress Notes (Signed)
   Chief Complaint  Patient presents with   Toe Pain    Bilateral skin redness and blisters on toes. Came up 3 weeks ago, she has completed 5 days of Doxycycline, and started betamethsone ointment 6 days ago bid with no relief.     HPI: 28 y.o. female presents today with c/o rash around the toenails on both feet.  She has been evaluate elsewhere and was placed on antibiotics and topical steroid ointment.  She hasn't noted any improvement.    Past Medical History:  Diagnosis Date   GERD (gastroesophageal reflux disease)    IBS (irritable bowel syndrome)     Past Surgical History:  Procedure Laterality Date   COLONOSCOPY     LAPAROSCOPIC BILATERAL SALPINGECTOMY Bilateral 06/04/2023   Procedure: LAPAROSCOPIC BILATERAL SALPINGECTOMY;  Surgeon: Marlow Baars, MD;  Location: [facility] [place];  Service: Gynecology;  Laterality: Bilateral;   WISDOM TOOTH EXTRACTION     WRIST SURGERY      Allergies  Allergen Reactions   Bactrim [Sulfamethoxazole-Trimethoprim]     Headache    Cefzil [Cefprozil] Hives   Zithromax [Azithromycin] Hives    Physical Exam: Palpable pedal pulses bilateral.  The periungual region of all toes have a skin eruption present with tiny raised papules and thickening erythematous skin around the nails.  Nails appear wnl.  Epicritic sensation intact.  No visible drainage.  Assessment/Plan of Care: 1. Tinea pedis of both feet   2. Rash      Meds ordered this encounter  Medications   terbinafine (LAMISIL) 250 MG tablet    Sig: Take 1 tablet (250 mg total) by mouth daily.    Dispense:  30 tablet    Refill:  0   clotrimazole-betamethasone (LOTRISONE) cream    Sig: Apply 1 Application topically 2 (two) times daily.    Dispense:  60 g    Refill:  1   Discussed clinical findings with patient today.  Will start the patient on oral antifungal medication, as well as Lotrisone cream to calm itch and fungus.  Will recheck in 3-4 weeks.     Clerance Lav, DPM, FACFAS [facility]     [address] [place], [place] [address]                Office [phone]  Fax [phone]

## 2023-07-25 ENCOUNTER — Encounter: Payer: Self-pay | Admitting: Podiatry

## 2023-07-25 DIAGNOSIS — B353 Tinea pedis: Secondary | ICD-10-CM | POA: Insufficient documentation

## 2023-07-26 ENCOUNTER — Telehealth: Payer: Self-pay | Admitting: Podiatry

## 2023-07-26 ENCOUNTER — Other Ambulatory Visit: Payer: Self-pay | Admitting: Podiatry

## 2023-07-26 MED ORDER — METHYLPREDNISOLONE 4 MG PO TBPK: ORAL_TABLET | ORAL | 0 refills | Status: DC

## 2023-07-26 MED ORDER — METHYLPREDNISOLONE 4 MG PO TBPK: ORAL_TABLET | ORAL | 0 refills | Status: AC

## 2023-07-26 NOTE — Progress Notes (Signed)
Patient requested different pharmacy for the Medrol Dose pack.  Updated.

## 2023-07-26 NOTE — Telephone Encounter (Signed)
Medrol Dose pack sent to [facility] on [address] after receiving patient's message that blisters around toes haven't improved much.

## 2023-07-26 NOTE — Telephone Encounter (Signed)
Pt states medication prescribed this morning for fungal infection had a notice online saying "do not take for fungal infections". She wanted to verify the notice/medication before picking it up.

## 2023-08-11 ENCOUNTER — Other Ambulatory Visit: Payer: Self-pay | Admitting: Podiatry

## 2023-08-19 ENCOUNTER — Other Ambulatory Visit: Payer: Self-pay | Admitting: Podiatry

## 2023-08-19 MED ORDER — TERBINAFINE HCL 250 MG PO TABS: 250.0000 mg | ORAL_TABLET | Freq: Every day | ORAL | 0 refills | Status: AC

## 2023-08-19 NOTE — Progress Notes (Signed)
 Patient requested 30 day refill on oral terbinafine, noting skin rash around nails is not 100% resolved yet.  Rx sent

## 2023-10-27 ENCOUNTER — Ambulatory Visit: Admitting: Podiatry

## 2023-10-27 DIAGNOSIS — L603 Nail dystrophy: Secondary | ICD-10-CM

## 2023-10-31 NOTE — Progress Notes (Signed)
 Chief Complaint  Patient presents with   Tinea Pedis    She is here today in follow up for the redness and blisters. She did stop the oral anti fungal a little early. Last flare up was 2 weeks ago, in the sun and with closed toe shoes on. She states it is some better today but wants a more targeted therapy. She is using the lotromin spray, instead of the topical for sensory reason and ease.    HPI: 28 y.o. female presents today noting she had a flareup of the rash/blistering reaction on her toes recently.  The medical assistant informed me that the patient stated she stopped her oral antifungal early.  Patient notes that she had a flareup after recently being in the sun with closed toe shoes on.  Patient stated that it has gotten better by the appointment today.  She has been using an antifungal spray instead of the prescription topical cream that had been sent in previously.  Past Medical History:  Diagnosis Date   GERD (gastroesophageal reflux disease)    IBS (irritable bowel syndrome)     Past Surgical History:  Procedure Laterality Date   COLONOSCOPY     LAPAROSCOPIC BILATERAL SALPINGECTOMY Bilateral 06/04/2023   Procedure: LAPAROSCOPIC BILATERAL SALPINGECTOMY;  Surgeon: Luan Rumpf, MD;  Location: [facility];  Service: Gynecology;  Laterality: Bilateral;   WISDOM TOOTH EXTRACTION     WRIST SURGERY      Allergies  Allergen Reactions   Bactrim [Sulfamethoxazole-Trimethoprim]     Headache    Cefzil [Cefprozil] Hives   Zithromax [Azithromycin] Hives    Physical Exam: Palpable pedal pulses noted.  No rash or skin eruption is able to be visualized in the forefoot today.  The patient did show me a photo of the eruption that occurred approximately 2 weeks ago.  There is a solitary, raised, well-defined slightly red papule noted on the dorsal aspect of one of the lesser toes.  This did not appear to be draining in the photo.  Interspaces are clear.  Some of the nails  have yellow discoloration with mild subungual debris, minimal distal onycholysis, noting possible fungal involvement of the toenails.  Assessment/Plan of Care: 1. Nail dystrophy    Discussed clinical findings with patient today.  Informed patient that we would need to see her around the time of a skin eruption so that we can actually biopsy the rash or papule.  If we were to biopsy the skin today would most likely come back without any pathology.  Patient instructed to notify the schedulers that if she calls in the future noting the rash, she will need to be scheduled within 24 to 48 hours to have the biopsy performed of the rash before it might clear up.  Samples of the hallux nail clippings were obtained and sent for fungal culture with [facility].  Informed the patient it may take up to 3 weeks to get the fungal cultures back.  Will notify the patient and discuss treatment options, if needed.   Joe Murders, DPM, FACFAS [facility]     [address] [place] [address]                Office [phone])  [phone]  Fax [phone]

## 2023-11-02 ENCOUNTER — Ambulatory Visit: Admitting: Podiatry

## 2023-11-04 ENCOUNTER — Encounter: Payer: Self-pay | Admitting: Podiatry

## 2023-11-08 ENCOUNTER — Ambulatory Visit: Admitting: Podiatry

## 2023-11-08 DIAGNOSIS — L309 Dermatitis, unspecified: Secondary | ICD-10-CM | POA: Diagnosis not present

## 2023-11-10 NOTE — Progress Notes (Addendum)
   Chief Complaint  Patient presents with   Foot Pain    Re-occurring blisters after fungal treatment. 2 pain.    HPI: 28 y.o. female presenting today for acute flareup of erythema and dermatitis to the toes bilateral.  Prior visit she was recommended to come in for an acute flareup and discussed the possibility of a punch biopsy.  This is been an ongoing issue for a few years now. It does seem to be induced by heat.  She was seen by dermatology who prescribed topical steroid which did not help she states.  Presenting for follow-up treatment evaluation  Past Medical History:  Diagnosis Date   GERD (gastroesophageal reflux disease)    IBS (irritable bowel syndrome)     Past Surgical History:  Procedure Laterality Date   COLONOSCOPY     LAPAROSCOPIC BILATERAL SALPINGECTOMY Bilateral 06/04/2023   Procedure: LAPAROSCOPIC BILATERAL SALPINGECTOMY;  Surgeon: Luan Rumpf, MD;  Location: [facility] [place];  Service: Gynecology;  Laterality: Bilateral;   WISDOM TOOTH EXTRACTION     WRIST SURGERY      Allergies  Allergen Reactions   Bactrim [Sulfamethoxazole-Trimethoprim]     Headache    Banana Other (See Comments)    Makes tongue hurt   Cefzil [Cefprozil] Hives   Zithromax [Azithromycin] Hives     Physical Exam: General: The patient is alert and oriented x3 in no acute distress.  Dermatology: Skin is warm, dry and supple bilateral lower extremities.  Very mild localized dermatitis of the skin noted to the dorsum of the toes bilateral.  There were very small pinpoint mostly resolved vesicular lesions to the toes however the patient states that she does not notice any drainage that comes from the lesions when they develop.  Pruritus noted.  Vascular: Palpable pedal pulses bilaterally. Capillary refill within normal limits.  No appreciable edema.  No erythema.  Neurological: Grossly intact via light touch  Musculoskeletal Exam: No pedal deformities noted   Assessment/Plan  of Care: 1.  Atopic dermatitis bilateral toes secondary to eczema  -Patient evaluated -I explained to the patient that currently I do not see any benefit of a punch biopsy since there are no visible lesions to the toes -Based on the patient's clinical exam and history I do believe that this is a form of eczema.  It is heat induced. -She states that topical antifungals have helped in the past.  Continue as needed, however I did reinforce that I do believe this is associated with eczema versus chronic intermittent tinea pedis/athlete's foot -Recommend good supportive tennis shoes and keeping her feet dry -Return clinic as needed     Dot Gazella, DPM [facility]  Dr. Dot Gazella, DPM    [address] [place], [place] [address]                Office [phone]  Fax [phone]

## 2024-02-03 DIAGNOSIS — Z01419 Encounter for gynecological examination (general) (routine) without abnormal findings: Secondary | ICD-10-CM | POA: Diagnosis not present

## 2024-02-03 DIAGNOSIS — Z113 Encounter for screening for infections with a predominantly sexual mode of transmission: Secondary | ICD-10-CM | POA: Diagnosis not present

## 2024-03-07 DIAGNOSIS — J302 Other seasonal allergic rhinitis: Secondary | ICD-10-CM | POA: Diagnosis not present

## 2024-03-07 DIAGNOSIS — K219 Gastro-esophageal reflux disease without esophagitis: Secondary | ICD-10-CM | POA: Diagnosis not present

## 2024-03-07 DIAGNOSIS — Z113 Encounter for screening for infections with a predominantly sexual mode of transmission: Secondary | ICD-10-CM | POA: Diagnosis not present

## 2024-03-07 DIAGNOSIS — Z0000 Encounter for general adult medical examination without abnormal findings: Secondary | ICD-10-CM | POA: Diagnosis not present

## 2024-03-07 DIAGNOSIS — K580 Irritable bowel syndrome with diarrhea: Secondary | ICD-10-CM | POA: Diagnosis not present

## 2024-03-07 DIAGNOSIS — Z13220 Encounter for screening for lipoid disorders: Secondary | ICD-10-CM | POA: Diagnosis not present

## 2024-04-03 DIAGNOSIS — Z113 Encounter for screening for infections with a predominantly sexual mode of transmission: Secondary | ICD-10-CM | POA: Diagnosis not present

## 2024-05-03 DIAGNOSIS — M791 Myalgia, unspecified site: Secondary | ICD-10-CM | POA: Diagnosis not present

## 2024-05-31 ENCOUNTER — Ambulatory Visit: Admitting: Physician Assistant

## 2024-05-31 ENCOUNTER — Encounter: Payer: Self-pay | Admitting: Physician Assistant

## 2024-05-31 VITALS — BP 128/88

## 2024-05-31 DIAGNOSIS — L308 Other specified dermatitis: Secondary | ICD-10-CM

## 2024-05-31 DIAGNOSIS — L309 Dermatitis, unspecified: Secondary | ICD-10-CM

## 2024-05-31 MED ORDER — TRIAMCINOLONE ACETONIDE 0.1 % EX CREA: 1.0000 | TOPICAL_CREAM | Freq: Two times a day (BID) | CUTANEOUS | 1 refills | Status: AC

## 2024-05-31 NOTE — Progress Notes (Signed)
   New Patient Visit   Subjective  Julie [name] is a 28 y.o. female NEW PATIENT who presents for the following: Blisters of feet that started about a year ago. It comes and goes and it is not flared right now. She has pictures of it. It is painful and burns. It flares with heat, friction from closed toed shoes or direct sunlight. She has taken 2 months of terbinafine  and used used Lotrisone . She did try hypochlorous acid spray also. She has seen 2 podiatrists and the last one said it was eczema and would not do a biopsy. It seems worse if she wears her work shoes, combat boots (for paintball) or snow boots. She does wear compression socks and they seem to help.  Does have known hand eczema and uses TAC cream.    The following portions of the chart were reviewed this encounter and updated as appropriate: medications, allergies, medical history  Review of Systems:  No other skin or systemic complaints except as noted in HPI or Assessment and Plan.  Objective  Well appearing patient in no apparent distress; mood and affect are within normal limits.  A focused examination was performed of the following areas: Feet and hands   Relevant exam findings are noted in the Assessment and Plan.    Assessment & Plan   Dermatitis unspecified   Exam: Clear today  Treatment Plan: - reviewed photos from her iPhone  - lengthy conversation regarding potential diagnoses  - No treatment today as it is clear. Advised patient to send a MyChart message with the next flare and we can work her in for a possible biopsy.  HAND DERMATITIS / ECZEMA  - TMC 0.1% cream Apply to affected areas twice daily until clear.    No follow-ups on file.  I, Roseline Hutchinson, CMA, am acting as scribe for Kyair Ditommaso K, PA-C .   Documentation: I have reviewed the above documentation for accuracy and completeness, and I agree with the above.  Jakota Manthei K, PA-C

## 2024-06-02 ENCOUNTER — Encounter: Payer: Self-pay | Admitting: Physician Assistant

## 2024-07-14 ENCOUNTER — Encounter: Payer: Self-pay | Admitting: Physician Assistant
